# Patient Record
Sex: Male | Born: 2000 | Race: White | Hispanic: Yes | Marital: Single | Smoking: Never smoker
Health system: Southern US, Community
[De-identification: ages and names within clinical notes are randomized; demographics above are authoritative.]

---
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
## 2023-03-18 ENCOUNTER — Encounter (HOSPITAL_COMMUNITY): Payer: Self-pay

## 2023-03-18 ENCOUNTER — Emergency Department (HOSPITAL_COMMUNITY)
Admission: EM | Admit: 2023-03-18 | Discharge: 2023-03-18 | Disposition: A | Discharge status: Home / Self Care | Payer: Self-pay | Attending: Emergency Medicine | Admitting: Emergency Medicine

## 2023-03-18 DIAGNOSIS — J101 Influenza due to other identified influenza virus with other respiratory manifestations: Secondary | ICD-10-CM | POA: Insufficient documentation

## 2023-03-18 DIAGNOSIS — R059 Cough, unspecified: Secondary | ICD-10-CM | POA: Diagnosis present

## 2023-03-18 LAB — RESP PANEL BY RT-PCR (RSV, FLU A&B, COVID)  RVPGX2
Influenza A by PCR: POSITIVE — AB
Influenza B by PCR: NEGATIVE
Resp Syncytial Virus by PCR: NEGATIVE
SARS Coronavirus 2 by RT PCR: NEGATIVE

## 2023-03-18 MED ORDER — OSELTAMIVIR PHOSPHATE 75 MG PO CAPS: 75.0000 mg | ORAL_CAPSULE | Freq: Two times a day (BID) | ORAL | 0 refills | Status: AC

## 2023-03-18 NOTE — Discharge Instructions (Addendum)
 You have been seen today for your complaint of flu symptoms. Your lab work was positive for influenza A. Your discharge medications include Tamiflu.  This is an antiviral.  Take this as prescribed and for the entire duration of the prescription.  It may cause some nausea. Alternate tylenol and ibuprofen for pain and fevers. You may alternate these every 4 hours. You may take up to 800 mg of ibuprofen at a time and up to 1000 mg of tylenol. Use Claritin and Flonase for congestion and runny nose.  These are both over-the-counter medications. Use Delsym, Robitussin DM or Mucinex DM for your cough Home care instructions are as follows:  Drink plenty of water.  Eat a normal diet.  Eat frequent small meals if you lose your appetite Follow up with: Your primary care provider Please seek immediate medical care if you develop any of the following symptoms: New or worsening symptoms At this time there does not appear to be the presence of an emergent medical condition, however there is always the potential for conditions to change. Please read and follow the below instructions.  Do not take your medicine if  develop an itchy rash, swelling in your mouth or lips, or difficulty breathing; call 911 and seek immediate emergency medical attention if this occurs.  You may review your lab tests and imaging results in their entirety on your MyChart account.  Please discuss all results of fully with your primary care provider and other specialist at your follow-up visit.  Note: Portions of this text may have been transcribed using voice recognition software. Every effort was made to ensure accuracy; however, inadvertent computerized transcription errors may still be present.

## 2023-03-18 NOTE — ED Triage Notes (Addendum)
 Pt c/o cough and nasal congestion starting last night.  Pt reports sick contacts.  Non-productive, dry cough noted.  Pt reports throat is "raspy and itchy."

## 2023-03-18 NOTE — ED Provider Triage Note (Signed)
 Emergency Medicine Provider Triage Evaluation Note  Brett Ritter , a 23 y.o. male  was evaluated in triage.  Pt complains of flu symptoms including cough, congestion, rhinorrhea, sore throat.  Symptoms began yesterday evening.  States some of his friends have similar symptoms.  Did not receive his flu vaccine this year.  Review of Systems  Positive: As above Negative: As above  Physical Exam  BP (!) 138/58 (BP Location: Right Arm)   Pulse 93   Temp 98.9 F (37.2 C)   Resp 17   SpO2 100%  Gen:   Awake, no distress   Resp:  Normal effort  MSK:   Moves extremities without difficulty  Other:    Medical Decision Making  Medically screening exam initiated at 3:40 PM.  Appropriate orders placed.  Brett Ritter was informed that the remainder of the evaluation will be completed by another provider, this initial triage assessment does not replace that evaluation, and the importance of remaining in the ED until their evaluation is complete.  Workup initiated   Brett Ritter, Brett Ritter 03/18/23 1541

## 2023-03-18 NOTE — ED Provider Notes (Signed)
 [place] EMERGENCY DEPARTMENT AT [facility] Provider Note   CSN: [id number] Arrival date & time: 03/18/23  1518     History  Chief Complaint  Patient presents with   Cough   Nasal Congestion    Brett Ritter is a 23 y.o. male.  Presenting to the ED for evaluation of flulike symptoms including cough, congestion, sore throat, rhinorrhea.  Symptoms began yesterday evening after work.  States some of his friends have similar symptoms.  He did not receive his flu vaccine this year.  No fevers.   Cough Associated symptoms: rhinorrhea        Home Medications Prior to Admission medications   Medication Sig Start Date End Date Taking? Authorizing Provider  oseltamivir (TAMIFLU) 75 MG capsule Take 1 capsule (75 mg total) by mouth every 12 (twelve) hours. 03/18/23  Yes Floyce Bujak, Edsel Petrin, PA-C  ondansetron (ZOFRAN ODT) 4 MG disintegrating tablet Take 1 tablet (4 mg total) by mouth every 8 (eight) hours as needed for nausea or vomiting. 05/18/13   Marcellina Millin, MD  Ondansetron HCl (ZOFRAN PO) Take 1 tablet by mouth once.    [provider]      Allergies    Patient has no known allergies.    Review of Systems   Review of Systems  HENT:  Positive for congestion and rhinorrhea.   Respiratory:  Positive for cough.   All other systems reviewed and are negative.   Physical Exam Updated Vital Signs BP (!) 138/58 (BP Location: Right Arm)   Pulse 93   Temp 98.9 F (37.2 C)   Resp 17   SpO2 100%  Physical Exam Vitals and nursing note reviewed.  Constitutional:      General: He is not in acute distress.    Appearance: Normal appearance. He is normal weight. He is not ill-appearing.     Comments: Resting comfortably in chair  HENT:     Head: Normocephalic and atraumatic.  Pulmonary:     Effort: Pulmonary effort is normal. No respiratory distress.  Abdominal:     General: Abdomen is flat.  Musculoskeletal:        General: Normal range of motion.      Cervical back: Neck supple.  Skin:    General: Skin is warm and dry.  Neurological:     Mental Status: He is alert and oriented to person, place, and time.  Psychiatric:        Mood and Affect: Mood normal.        Behavior: Behavior normal.     ED Results / Procedures / Treatments   Labs (all labs ordered are listed, but only abnormal results are displayed) Labs Reviewed  RESP PANEL BY RT-PCR (RSV, FLU A&B, COVID)  RVPGX2 - Abnormal; Notable for the following components:      Result Value   Influenza A by PCR POSITIVE (*)    All other components within normal limits    EKG None  Radiology No results found.  Procedures Procedures    Medications Ordered in ED Medications - No data to display  ED Course/ Medical Decision Making/ A&P Clinical Course as of 03/18/23 1720  Wed Mar 18, 2023  1651 Influenza A By PCR(!): POSITIVE [SG]    Clinical Course User Index [SG] Sloan Leiter, DO  Medical Decision Making Amount and/or Complexity of Data Reviewed Labs:  Decision-making details documented in ED Course.  This patient presents to the ED for concern of flu symptoms, this involves an extensive number of treatment options, and is a complaint that carries with it a high risk of complications and morbidity.  The differential diagnosis includes flu, COVID, RSV  My initial workup includes respiratory panel  Additional history obtained from: Nursing notes from this visit.  I ordered, reviewed and interpreted labs which include: Respiratory panel positive for influenza A  Afebrile, hemodynamically stable.  23 year old male presenting to the ED for evaluation of flulike symptoms.  Symptoms began yesterday evening.  Multiple sick contacts.  Respiratory panel positive for influenza A.  Likely source of his symptoms.  Had a shared decision-making conversation with the patient regarding Tamiflu.  He requests this medication.  He was educated on  typical timeline of symptoms and supportive care.  He was given return precautions.  Stable at discharge.  At this time there does not appear to be any evidence of an acute emergency medical condition and the patient appears stable for discharge with appropriate outpatient follow up. Diagnosis was discussed with patient who verbalizes understanding of care plan and is agreeable to discharge. I have discussed return precautions with patient who verbalizes understanding. Patient encouraged to follow-up with their PCP within 1 week. All questions answered.  Note: Portions of this report may have been transcribed using voice recognition software. Every effort was made to ensure accuracy; however, inadvertent computerized transcription errors may still be present.        Final Clinical Impression(s) / ED Diagnoses Final diagnoses:  Influenza A    Rx / DC Orders ED Discharge Orders          Ordered    oseltamivir (TAMIFLU) 75 MG capsule  Every 12 hours        03/18/23 1718              Michelle Piper, PA-C 03/18/23 1720    Sloan Leiter, DO 03/19/23 1947

## 2023-05-25 ENCOUNTER — Other Ambulatory Visit: Payer: Self-pay

## 2023-05-25 ENCOUNTER — Ambulatory Visit (INDEPENDENT_AMBULATORY_CARE_PROVIDER_SITE_OTHER): Payer: Self-pay

## 2023-05-25 ENCOUNTER — Ambulatory Visit
Admission: EM | Admit: 2023-05-25 | Discharge: 2023-05-25 | Disposition: A | Discharge status: Home / Self Care | Payer: Self-pay | Attending: Family Medicine | Admitting: Family Medicine

## 2023-05-25 DIAGNOSIS — R051 Acute cough: Secondary | ICD-10-CM

## 2023-05-25 DIAGNOSIS — R0789 Other chest pain: Secondary | ICD-10-CM

## 2023-05-25 MED ORDER — NAPROXEN 500 MG PO TABS: 500.0000 mg | ORAL_TABLET | Freq: Two times a day (BID) | ORAL | 0 refills | Status: AC | PRN

## 2023-05-25 NOTE — Discharge Instructions (Addendum)
 X-ray and EKG were normal.  Your symptoms are more consistent with a musculoskeletal cause of your chest wall pain.  Start naproxen twice daily as needed for this.  Take it with food.  May do heat to the chest and lots of rest.  Follow-up with your PCP in 2 days for recheck.  Please go to the ER if you develop any worsening symptoms.  Hope you feel better soon!

## 2023-05-25 NOTE — ED Triage Notes (Signed)
 Pt c/o left sided chest pain w/movement and with deep inspiration and expirationx2d. Pt denies N/V. Pt c/o productive w/yellow mucous. Pt c/o SOB. Pt is eupneic.

## 2023-05-25 NOTE — ED Provider Notes (Signed)
 [facility]    CSN: [id number] Arrival date & time: 05/25/23  1749      History   Chief Complaint No chief complaint on file.   HPI Brett Ritter is a 23 y.o. male  presents for evaluation of URI symptoms for 3 days. Patient reports associated symptoms of sore throat has not resolved, cough/congestion with chest pain with deep breathing and movement/coughing, shortness of breath after coughing. Denies N/V/D, fever, ear pain, body aches. Patient does not have a hx of asthma. Patient does vape.  Also reports has been working out doing push-ups.  Reports no sick contacts.  Pt has taken nothing OTC for symptoms. Pt has no other concerns at this time.   HPI  History reviewed. No pertinent past medical history.  There are no active problems to display for this patient.   History reviewed. No pertinent surgical history.     Home Medications    Prior to Admission medications   Medication Sig Start Date End Date Taking? Authorizing Provider  naproxen (NAPROSYN) 500 MG tablet Take 1 tablet (500 mg total) by mouth 2 (two) times daily as needed (chest wall pain). 05/25/23  Yes Archana Eckman, Jodi R, NP  ondansetron  (ZOFRAN  ODT) 4 MG disintegrating tablet Take 1 tablet (4 mg total) by mouth every 8 (eight) hours as needed for nausea or vomiting. 05/18/13   Angeline Barefoot, MD  Ondansetron  HCl (ZOFRAN  PO) Take 1 tablet by mouth once.    [provider]  oseltamivir  (TAMIFLU ) 75 MG capsule Take 1 capsule (75 mg total) by mouth every 12 (twelve) hours. 03/18/23   Schutt, Coni Deep, PA-C    Family History History reviewed. No pertinent family history.  Social History Social History   Tobacco Use   Smoking status: Never   Smokeless tobacco: Never  Vaping Use   Vaping status: Every Day   Substances: Nicotine  Substance Use Topics   Alcohol use: Yes    Comment: occ   Drug use: Yes    Types: Marijuana     Allergies   Patient has no known allergies.   Review of  Systems Review of Systems  HENT:  Positive for congestion.   Respiratory:  Positive for cough and shortness of breath.        Chest wall pain     Physical Exam Triage Vital Signs ED Triage Vitals  Encounter Vitals Group     BP 05/25/23 1904 123/76     Systolic BP Percentile --      Diastolic BP Percentile --      Pulse Rate 05/25/23 1904 (!) 55     Resp 05/25/23 1904 17     Temp 05/25/23 1904 98.2 F (36.8 C)     Temp Source 05/25/23 1904 Oral     SpO2 05/25/23 1904 98 %     Weight --      Height --      Head Circumference --      Peak Flow --      Pain Score 05/25/23 1902 4     Pain Loc --      Pain Education --      Exclude from Growth Chart --    No data found.  Updated Vital Signs BP 123/76   Pulse (!) 55   Temp 98.2 F (36.8 C) (Oral)   Resp 17   SpO2 98%   Visual Acuity Right Eye Distance:   Left Eye Distance:   Bilateral Distance:  Right Eye Near:   Left Eye Near:    Bilateral Near:     Physical Exam Vitals and nursing note reviewed.  Constitutional:      General: He is not in acute distress.    Appearance: Normal appearance. He is not ill-appearing or toxic-appearing.  HENT:     Head: Normocephalic and atraumatic.     Right Ear: Tympanic membrane and ear canal normal.     Left Ear: Tympanic membrane and ear canal normal.     Nose: Congestion present.     Mouth/Throat:     Mouth: Mucous membranes are moist.     Pharynx: No oropharyngeal exudate or posterior oropharyngeal erythema.  Eyes:     Pupils: Pupils are equal, round, and reactive to light.  Cardiovascular:     Rate and Rhythm: Normal rate and regular rhythm.     Heart sounds: Normal heart sounds.  Pulmonary:     Effort: Pulmonary effort is normal.     Breath sounds: No wheezing, rhonchi or rales.  Chest:     Chest wall: Tenderness present.  Musculoskeletal:     Cervical back: Normal range of motion and neck supple.  Lymphadenopathy:     Cervical: No cervical adenopathy.   Skin:    General: Skin is warm and dry.  Neurological:     General: No focal deficit present.     Mental Status: He is alert and oriented to person, place, and time.  Psychiatric:        Mood and Affect: Mood normal.        Behavior: Behavior normal.      UC Treatments / Results  Labs (all labs ordered are listed, but only abnormal results are displayed) Labs Reviewed - No data to display  EKG   Radiology DG Chest 2 View Result Date: 05/25/2023 CLINICAL DATA:  cough, chest wall pain EXAM: CHEST - 2 VIEW COMPARISON:  May 17, 2013 FINDINGS: No focal airspace consolidation, pleural effusion, or pneumothorax. No cardiomegaly. No acute fracture or destructive lesion. IMPRESSION: No acute cardiopulmonary abnormality. Electronically Signed   By: Rance Burrows M.D.   On: 05/25/2023 19:41    Procedures ED EKG  Date/Time: 05/25/2023 7:25 PM  Performed by: Alleen Arbour, NP Authorized by: Alleen Arbour, NP   ECG interpreted by ED Physician in the absence of a cardiologist: no   Previous ECG:    Previous ECG:  Unavailable Rate:    ECG rate:  48   ECG rate assessment: bradycardic   Rhythm:    Rhythm: sinus bradycardia   Ectopy:    Ectopy: none   QRS:    QRS axis:  Normal ST segments:    ST segments:  Normal T waves:    T waves: normal    (including critical care time)  Medications Ordered in UC Medications - No data to display  Initial Impression / Assessment and Plan / UC Course  I have reviewed the triage vital signs and the nursing notes.  Pertinent labs & imaging results that were available during my care of the patient were reviewed by me and considered in my medical decision making (see chart for details).     Reviewed exam and symptoms with patient.  No red flags.  Chest x-ray negative and EKG shows sinus bradycardia but no acute ST-T wave abnormalities.  Discussed with patient exam and symptoms consistent with musculoskeletal chest wall pain.  He declined any  COVID or flu testing.  Will do  trial of naproxen and heat and rest.  Advised PCP follow-up 2 to 3 days for recheck.  ER precautions reviewed and patient verbalized understanding. Final Clinical Impressions(s) / UC Diagnoses   Final diagnoses:  Acute cough  Acute chest wall pain     Discharge Instructions      X-ray and EKG were normal.  Your symptoms are more consistent with a musculoskeletal cause of your chest wall pain.  Start naproxen twice daily as needed for this.  Take it with food.  May do heat to the chest and lots of rest.  Follow-up with your PCP in 2 days for recheck.  Please go to the ER if you develop any worsening symptoms.  Hope you feel better soon!   ED Prescriptions     Medication Sig Dispense Auth. Provider   naproxen (NAPROSYN) 500 MG tablet Take 1 tablet (500 mg total) by mouth 2 (two) times daily as needed (chest wall pain). 14 tablet Dannis Deroche, Jodi R, NP      PDMP not reviewed this encounter.   Alleen Arbour, NP 05/25/23 1954

## 2023-05-25 NOTE — ED Triage Notes (Signed)
 I called pt twice and no answer and that includes calling phone number
